# Patient Record
Sex: Male | Born: 1985 | Hispanic: Yes | Marital: Married | Smoking: Never smoker
Health system: Southern US, Community
[De-identification: ages and names within clinical notes are randomized; demographics above are authoritative.]

## PROBLEM LIST (undated history)

## (undated) DIAGNOSIS — Z789 Other specified health status: Secondary | ICD-10-CM

## (undated) HISTORY — PX: APPENDECTOMY: SHX54

## (undated) HISTORY — DX: Other specified health status: Z78.9

---

## 2014-04-24 ENCOUNTER — Emergency Department (INDEPENDENT_AMBULATORY_CARE_PROVIDER_SITE_OTHER): Payer: BC Managed Care – PPO

## 2014-04-24 ENCOUNTER — Emergency Department (INDEPENDENT_AMBULATORY_CARE_PROVIDER_SITE_OTHER)
Admission: EM | Admit: 2014-04-24 | Discharge: 2014-04-24 | Disposition: A | Discharge status: Home / Self Care | Payer: BC Managed Care – PPO | Source: Home / Self Care | Attending: Emergency Medicine | Admitting: Emergency Medicine

## 2014-04-24 ENCOUNTER — Encounter: Payer: Self-pay | Admitting: *Deleted

## 2014-04-24 DIAGNOSIS — R7611 Nonspecific reaction to tuberculin skin test without active tuberculosis: Secondary | ICD-10-CM | POA: Diagnosis not present

## 2014-04-24 NOTE — ED Provider Notes (Signed)
CSN: [id number]     Arrival date & time 04/24/14  1321 History   First MD Initiated Contact with Patient 04/24/14 1322   Seen in [facility] Saturday 04/24/2014  Chief Complaint  Patient presents with  . PPD Reading    HPI Pt Had a PPD placed on R arm 07/24/2014 at [facility]. The site is red and raised measuring 15mm. Pt denies any symptoms and feels fine. He is asymptomatic. No cough or fever or night sweats or chest pain shortness of breath or weight loss or fatigue. Review of systems otherwise negative. 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 I questioned him about any history of tuberculosis, and he denies ever being diagnosed with tuberculosis or exposure to anyone with TB to his knowledge. He recalls that he had multiple immunizations in [place] as a child, and he may have received the BCG vaccine as a child, but he is not certain of this. 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 History reviewed. No pertinent past medical history. Past Surgical History  Procedure Laterality Date  . Appendectomy     History reviewed. No pertinent family history. History  Substance Use Topics  . Smoking status: Never Smoker   . Smokeless tobacco: Never Used  . Alcohol Use: No    Review of Systems Negative Allergies  Review of patient's allergies indicates no known allergies.  Home Medications   Prior to Admission medications   Not on File   BP 128/83 mmHg  Pulse 77  Temp(Src) 98.4 F (36.9 C) (Oral)  Ht  (1.727 m)  Wt 215 lb 8 oz (97.75 kg)  BMI 32.77 kg/m2  SpO2 97% Physical Exam Pleasant muscular male no distress. Right forearm: Red indurated 15 mm (where PPD was applied 2 days ago) ED Course  Procedures (including critical  care time) Labs Review Labs Reviewed - No data to display  Imaging Review Dg Chest 1 View  04/24/2014   CLINICAL DATA:  Positive PPD.  Asymptomatic.  EXAM: CHEST  1 VIEW  COMPARISON:  None.  FINDINGS: The cardiomediastinal silhouette is unremarkable.  There is no evidence of focal airspace disease, pulmonary edema, suspicious pulmonary nodule/mass, pleural effusion, or pneumothorax. No acute bony abnormalities are identified.  IMPRESSION: No active disease.   Electronically Signed   By: Harmon Pier M.D.   On: 04/24/2014 14:12     MDM   1. Positive PPD    15 mm. Asymptomatic.--- Normal chest x-ray  Over 20 minutes spent, greater than 50% of the time spent for counseling and coordination of care. I explained to him that his PPD was positive, but chest x-ray normal. No symptoms or signs of active tuberculosis. I explained that, in my opinion, his tuberculosis screening is negative for signs or symptoms of tuberculosis, so he may proceed with employment. I completed the "record of tuberculosis screening" form, gave him a copy of the form and normal chest x-ray report. I advised him the importance of following up with his PCP, advised to show PCP the forms and chest x-ray report, so the PCP can make any further decisions regarding any follow-up or treatment of the +PPD. Also advised him to never have another TB skin test, because it will always  be positive. We'll send a copy of this visit note and the "record of tuberculosis screening" form and chest x-ray report [facility].      Lajean Manesavid Massey, MD 04/24/14 [phone]

## 2014-04-24 NOTE — ED Notes (Signed)
Pt  Had a PPD placed on R arm 9-7 at [facility]  The site is red and raised measuring 15mm.  Pt denies any symptoms and feels fine.

## 2015-03-31 ENCOUNTER — Other Ambulatory Visit: Payer: Self-pay | Admitting: Adult Health

## 2015-03-31 ENCOUNTER — Ambulatory Visit (INDEPENDENT_AMBULATORY_CARE_PROVIDER_SITE_OTHER): Payer: Self-pay

## 2015-03-31 DIAGNOSIS — M25472 Effusion, left ankle: Secondary | ICD-10-CM

## 2015-03-31 DIAGNOSIS — R52 Pain, unspecified: Secondary | ICD-10-CM

## 2016-05-02 ENCOUNTER — Other Ambulatory Visit: Payer: Self-pay | Admitting: Emergency Medicine

## 2016-05-02 ENCOUNTER — Ambulatory Visit (INDEPENDENT_AMBULATORY_CARE_PROVIDER_SITE_OTHER): Payer: Self-pay

## 2016-05-02 DIAGNOSIS — R52 Pain, unspecified: Secondary | ICD-10-CM

## 2016-05-02 DIAGNOSIS — M79644 Pain in right finger(s): Secondary | ICD-10-CM

## 2017-07-11 ENCOUNTER — Ambulatory Visit (INDEPENDENT_AMBULATORY_CARE_PROVIDER_SITE_OTHER): Payer: Self-pay

## 2017-07-11 ENCOUNTER — Other Ambulatory Visit: Payer: Self-pay | Admitting: Gerontology

## 2017-07-11 DIAGNOSIS — S60222A Contusion of left hand, initial encounter: Secondary | ICD-10-CM

## 2017-07-11 DIAGNOSIS — M79645 Pain in left finger(s): Secondary | ICD-10-CM

## 2017-08-28 DIAGNOSIS — R7303 Prediabetes: Secondary | ICD-10-CM | POA: Insufficient documentation

## 2018-12-22 DIAGNOSIS — Z6835 Body mass index (BMI) 35.0-35.9, adult: Secondary | ICD-10-CM | POA: Diagnosis not present

## 2018-12-22 DIAGNOSIS — Z20828 Contact with and (suspected) exposure to other viral communicable diseases: Secondary | ICD-10-CM | POA: Diagnosis not present

## 2018-12-22 DIAGNOSIS — E669 Obesity, unspecified: Secondary | ICD-10-CM | POA: Diagnosis not present

## 2019-02-07 IMAGING — DX DG HAND COMPLETE 3+V*L*
3 series · 3 of 3 positions shown · non-contrast
Comparison: None.

CLINICAL DATA: Contusion. Shut left hand in car door with pain
predominantly involving the long finger. Initial encounter.

EXAM:
LEFT HAND - COMPLETE 3+ VIEW

[hand pa]
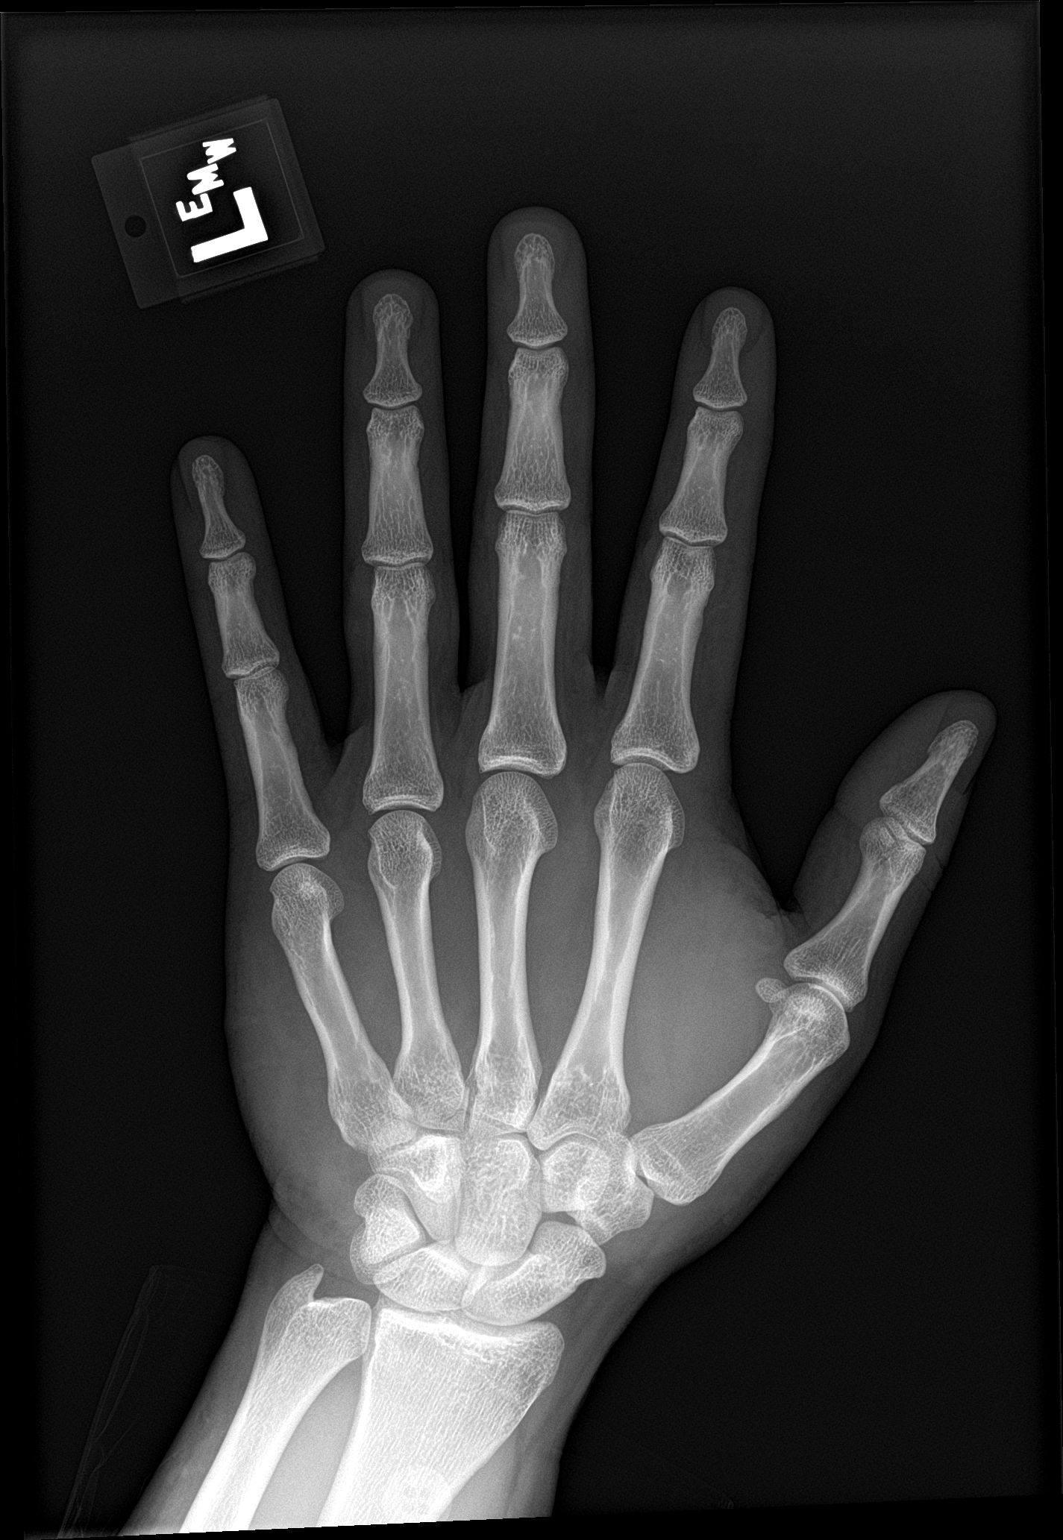

[hand obl]
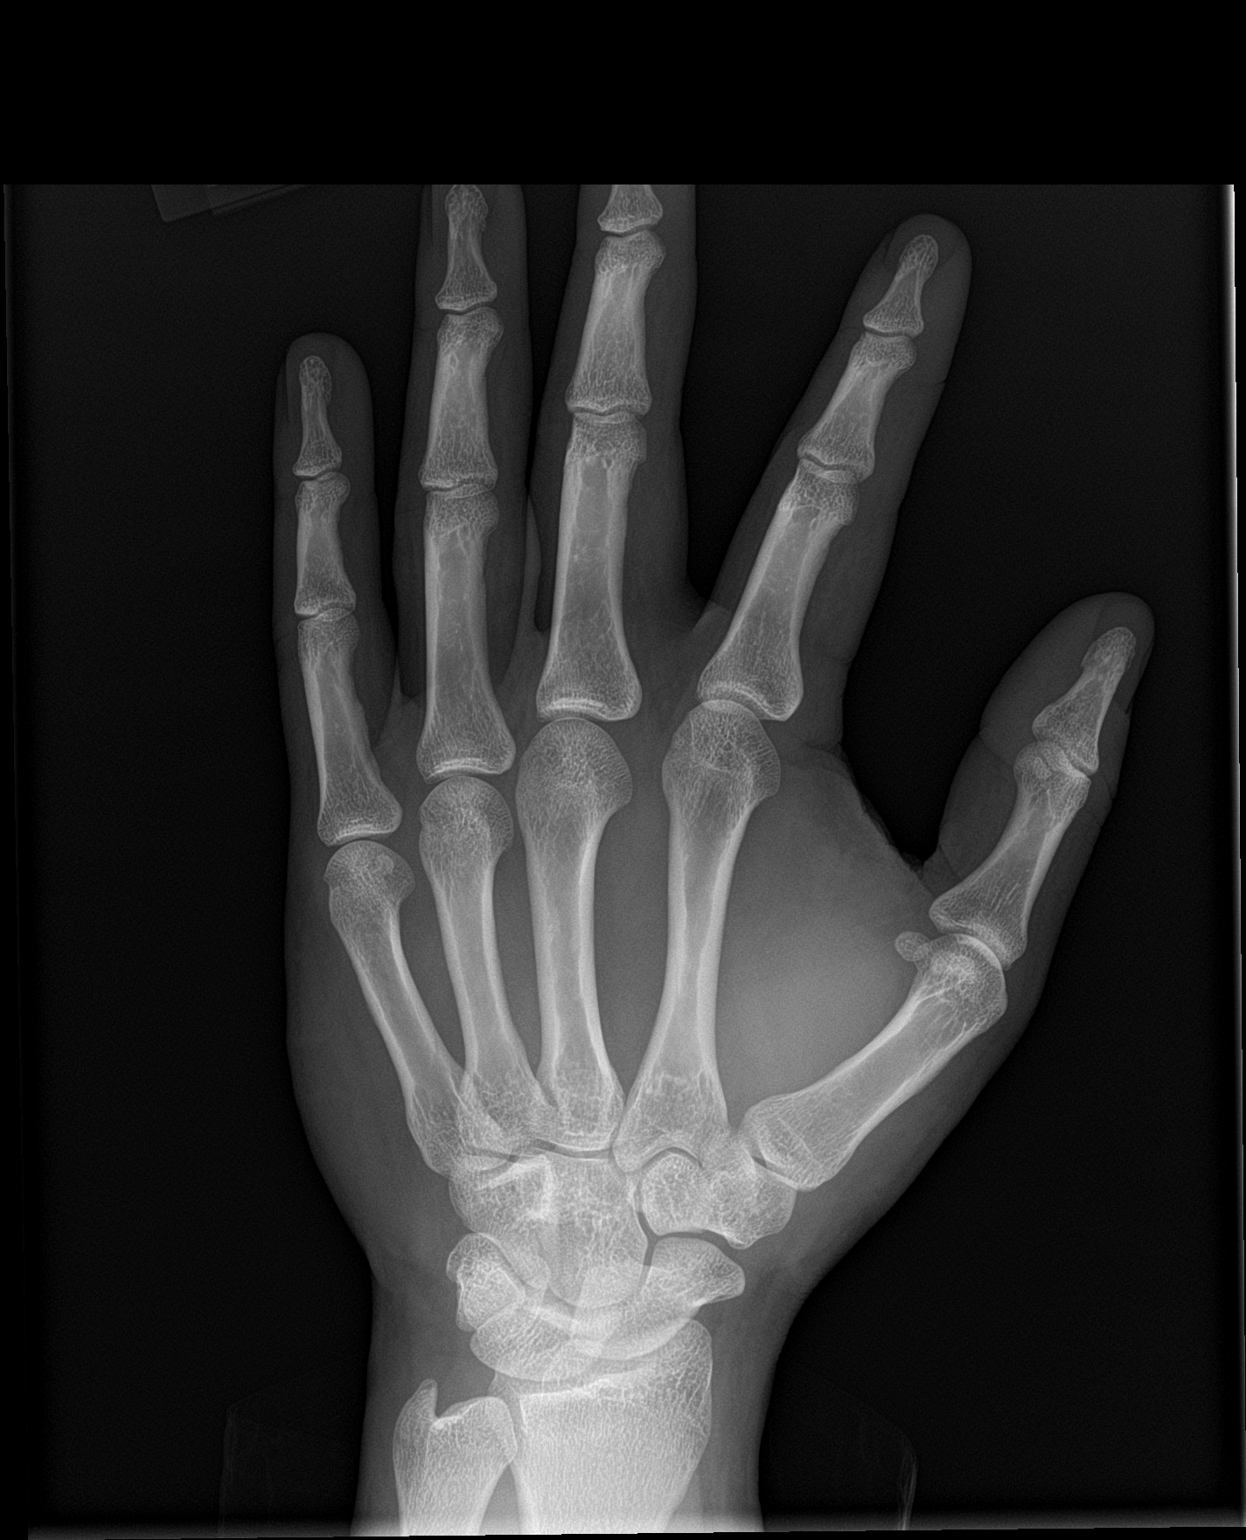

[hand lat]
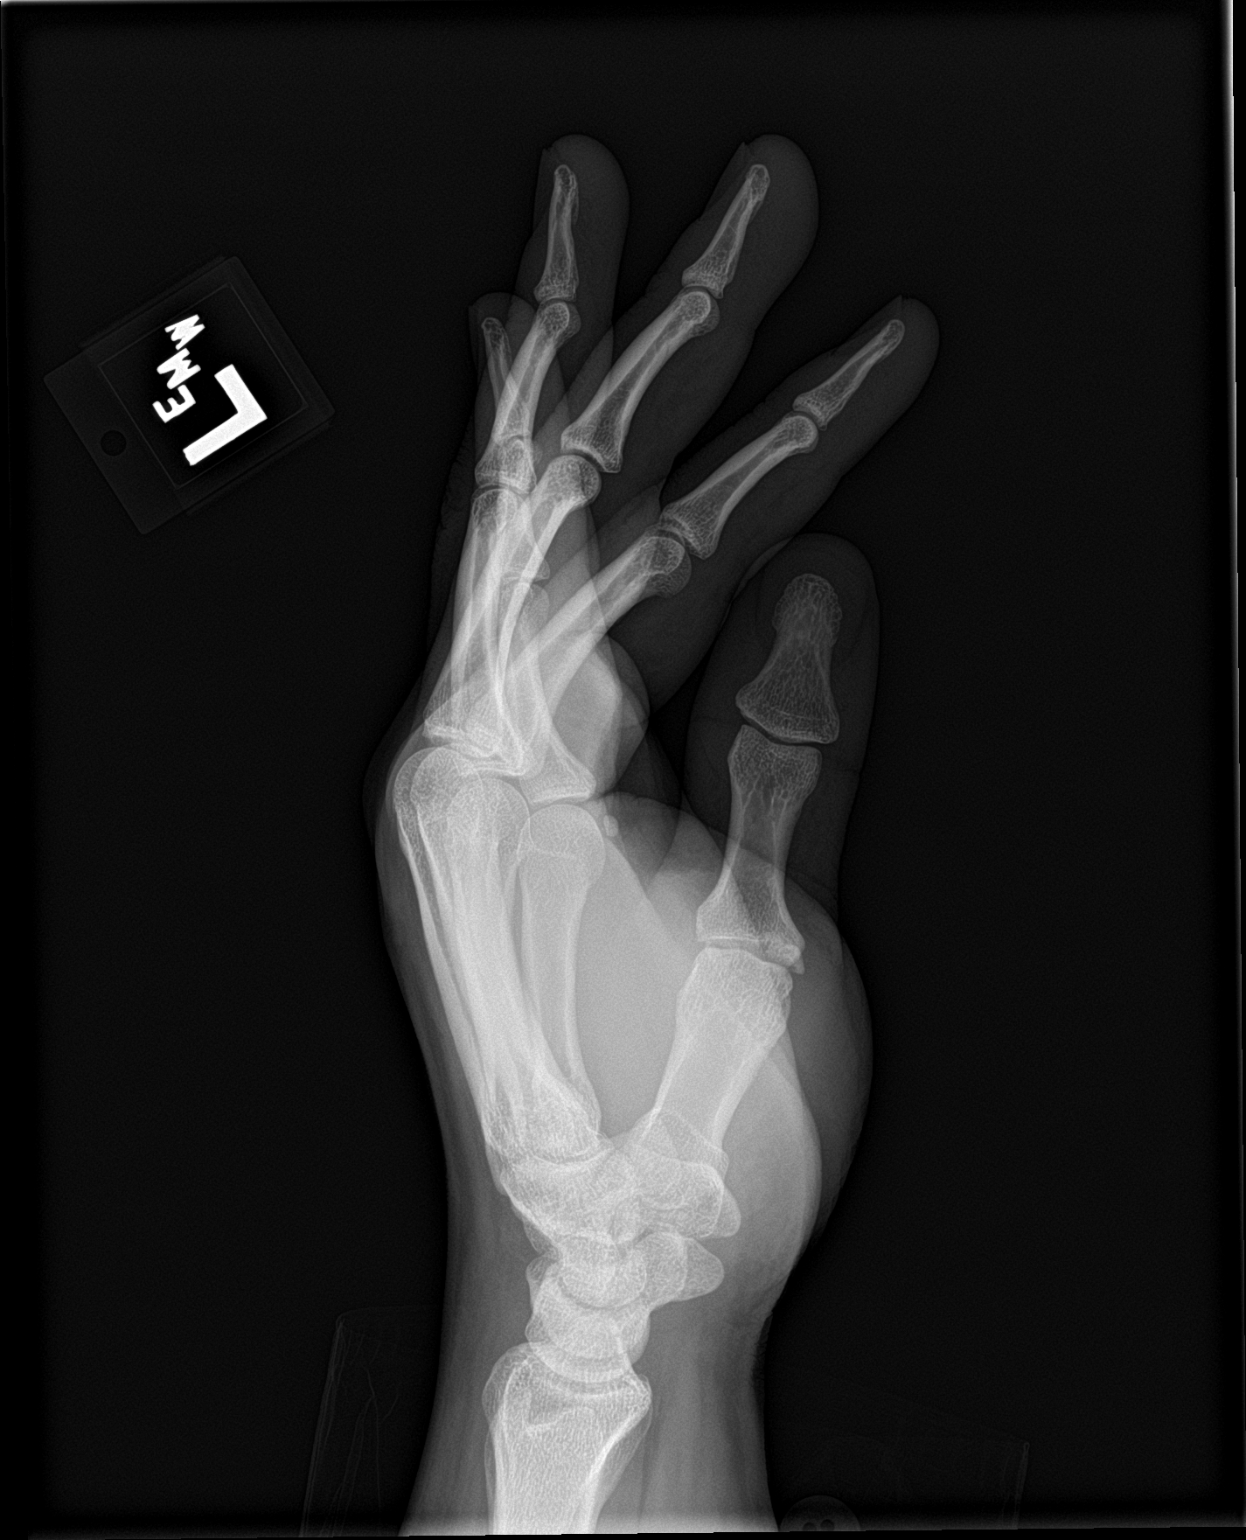

[3 of 3 positions shown; findings below may reference images not displayed]

FINDINGS: No acute fracture or dislocation is identified. Mild soft tissue
swelling is noted along the dorsum of the hand distally. Joint space
widths are preserved. No radiopaque foreign body is seen.
IMPRESSION: No acute osseous abnormality.

## 2019-05-15 DIAGNOSIS — M25561 Pain in right knee: Secondary | ICD-10-CM | POA: Diagnosis not present

## 2019-05-15 DIAGNOSIS — G8929 Other chronic pain: Secondary | ICD-10-CM | POA: Diagnosis not present

## 2019-05-15 DIAGNOSIS — M25461 Effusion, right knee: Secondary | ICD-10-CM | POA: Diagnosis not present

## 2019-05-22 DIAGNOSIS — G8929 Other chronic pain: Secondary | ICD-10-CM | POA: Diagnosis not present

## 2019-05-22 DIAGNOSIS — M25561 Pain in right knee: Secondary | ICD-10-CM | POA: Diagnosis not present

## 2019-05-22 DIAGNOSIS — S83281A Other tear of lateral meniscus, current injury, right knee, initial encounter: Secondary | ICD-10-CM | POA: Diagnosis not present

## 2019-05-22 DIAGNOSIS — M25461 Effusion, right knee: Secondary | ICD-10-CM | POA: Diagnosis not present

## 2019-05-28 DIAGNOSIS — M25461 Effusion, right knee: Secondary | ICD-10-CM | POA: Diagnosis not present

## 2019-05-28 DIAGNOSIS — S83281A Other tear of lateral meniscus, current injury, right knee, initial encounter: Secondary | ICD-10-CM | POA: Diagnosis not present

## 2019-05-28 DIAGNOSIS — S83269A Peripheral tear of lateral meniscus, current injury, unspecified knee, initial encounter: Secondary | ICD-10-CM | POA: Insufficient documentation

## 2019-06-04 DIAGNOSIS — Z20822 Contact with and (suspected) exposure to covid-19: Secondary | ICD-10-CM | POA: Diagnosis not present

## 2019-06-04 DIAGNOSIS — X58XXXA Exposure to other specified factors, initial encounter: Secondary | ICD-10-CM | POA: Diagnosis not present

## 2019-06-04 DIAGNOSIS — S83281A Other tear of lateral meniscus, current injury, right knee, initial encounter: Secondary | ICD-10-CM | POA: Diagnosis not present

## 2019-06-04 DIAGNOSIS — Z01812 Encounter for preprocedural laboratory examination: Secondary | ICD-10-CM | POA: Diagnosis not present

## 2019-06-08 DIAGNOSIS — S83281A Other tear of lateral meniscus, current injury, right knee, initial encounter: Secondary | ICD-10-CM | POA: Diagnosis not present

## 2019-06-08 DIAGNOSIS — E669 Obesity, unspecified: Secondary | ICD-10-CM | POA: Diagnosis not present

## 2019-06-08 DIAGNOSIS — M23200 Derangement of unspecified lateral meniscus due to old tear or injury, right knee: Secondary | ICD-10-CM | POA: Diagnosis not present

## 2019-06-16 DIAGNOSIS — R942 Abnormal results of pulmonary function studies: Secondary | ICD-10-CM | POA: Diagnosis not present

## 2019-06-16 DIAGNOSIS — M25461 Effusion, right knee: Secondary | ICD-10-CM | POA: Diagnosis not present

## 2019-06-19 DIAGNOSIS — M25461 Effusion, right knee: Secondary | ICD-10-CM | POA: Diagnosis not present

## 2019-08-21 DIAGNOSIS — Z20822 Contact with and (suspected) exposure to covid-19: Secondary | ICD-10-CM | POA: Diagnosis not present

## 2020-01-14 DIAGNOSIS — Z20822 Contact with and (suspected) exposure to covid-19: Secondary | ICD-10-CM | POA: Diagnosis not present

## 2020-01-14 DIAGNOSIS — J069 Acute upper respiratory infection, unspecified: Secondary | ICD-10-CM | POA: Diagnosis not present

## 2020-01-14 DIAGNOSIS — Z03818 Encounter for observation for suspected exposure to other biological agents ruled out: Secondary | ICD-10-CM | POA: Diagnosis not present

## 2020-08-30 ENCOUNTER — Other Ambulatory Visit: Payer: Self-pay

## 2020-08-30 ENCOUNTER — Emergency Department
Admission: EM | Admit: 2020-08-30 | Discharge: 2020-08-30 | Disposition: A | Discharge status: Home / Self Care | Payer: BC Managed Care – PPO | Source: Home / Self Care | Attending: Family Medicine | Admitting: Family Medicine

## 2020-08-30 DIAGNOSIS — A09 Infectious gastroenteritis and colitis, unspecified: Secondary | ICD-10-CM | POA: Diagnosis not present

## 2020-08-30 LAB — POCT URINALYSIS DIP (MANUAL ENTRY)
Bilirubin, UA: NEGATIVE
Blood, UA: NEGATIVE
Glucose, UA: NEGATIVE mg/dL
Ketones, POC UA: NEGATIVE mg/dL
Leukocytes, UA: NEGATIVE
Nitrite, UA: NEGATIVE
Protein Ur, POC: NEGATIVE mg/dL
Spec Grav, UA: 1.02 (ref 1.010–1.025)
Urobilinogen, UA: 0.2 E.U./dL
pH, UA: 7 (ref 5.0–8.0)

## 2020-08-30 MED ORDER — CIPROFLOXACIN HCL 500 MG PO TABS: 500.0000 mg | ORAL_TABLET | Freq: Two times a day (BID) | ORAL | 0 refills | Status: DC

## 2020-08-30 NOTE — ED Triage Notes (Signed)
Pt c/o rectal pain since returning from [place] on 7/31. Says he was sick with diarrhea and a fever for the first 3 days after returning. Denies any rectal bleeding. Some urinary frequency as well. Says he gets pelvic pain when he has the urge to urinate. Pain 4/10

## 2020-08-30 NOTE — ED Provider Notes (Signed)
[facility]    CSN: [id number] Arrival date & time: 08/30/20  1055      History   Chief Complaint Chief Complaint  Patient presents with   Urinary Frequency   Rectal Pain    HPI Randy Hatfield is a 35 y.o. male.   HPI  Patient traveled to [place] to visit with his mother and father.  He states that a few days after he arrived he had some diarrhea, body aches headache and fever.  This went away over a few days.  He did not have any COVID testing.  He is COVID vaccinated.  He states afterwards both of his parents had headaches and body aches as well.  He states they are not vaccinated.  In any event towards the end of his 2 weeks that he had diarrhea again.  At this time he thought it might be just from the food.  Now that he is home, 3 days after coming home he has had diarrhea a third time.  This is 3 times in the last couple of weeks.  He states that he has some rectal discomfort due to the recurring diarrhea.  He also has some dysuria.  Patient is married.  He had no sexual relations while he was in [place].  No concern for STD or urethritis.  History reviewed. No pertinent past medical history.  There are no problems to display for this patient.   Past Surgical History:  Procedure Laterality Date   APPENDECTOMY         Home Medications    Prior to Admission medications   Medication Sig Start Date End Date Taking? Authorizing Provider  ciprofloxacin (CIPRO) 500 MG tablet Take 1 tablet (500 mg total) by mouth 2 (two) times daily. 08/30/20  Yes Eustace Moore, MD    Family History History reviewed. No pertinent family history.  Social History Social History   Tobacco Use   Smoking status: Never   Smokeless tobacco: Never  Substance Use Topics   Alcohol use: No   Drug use: No     Allergies   Amoxicillin   Review of Systems Review of Systems See HPI  Physical Exam Triage Vital Signs ED Triage Vitals  Enc Vitals Group     BP 08/30/20 1107  (!) 136/91     Pulse Rate 08/30/20 1107 80     Resp 08/30/20 1107 17     Temp 08/30/20 1107 98.6 F (37 C)     Temp Source 08/30/20 1107 Oral     SpO2 08/30/20 1107 99 %     Weight --      Height --      Head Circumference --      Peak Flow --      Pain Score 08/30/20 1113 4     Pain Loc --      Pain Edu? --      Excl. in GC? --    No data found.  Updated Vital Signs BP (!) 136/91 (BP Location: Right Arm)   Pulse 80   Temp 98.6 F (37 C) (Oral)   Resp 17   SpO2 99%      Physical Exam Constitutional:      General: He is not in acute distress.    Appearance: He is well-developed.     Comments: No distress.  Muscular build  HENT:     Head: Normocephalic and atraumatic.     Mouth/Throat:     Comments: Mask is  in place Eyes:     Conjunctiva/sclera: Conjunctivae normal.     Pupils: Pupils are equal, round, and reactive to light.  Cardiovascular:     Rate and Rhythm: Normal rate.  Pulmonary:     Effort: Pulmonary effort is normal. No respiratory distress.  Abdominal:     General: There is no distension.     Palpations: Abdomen is soft.     Tenderness: There is no abdominal tenderness.  Musculoskeletal:        General: Normal range of motion.     Cervical back: Normal range of motion.  Skin:    General: Skin is warm and dry.  Neurological:     Mental Status: He is alert.  Psychiatric:        Mood and Affect: Mood normal.        Behavior: Behavior normal.     UC Treatments / Results  Labs (all labs ordered are listed, but only abnormal results are displayed) Labs Reviewed  POCT URINALYSIS DIP (MANUAL ENTRY)    EKG   Radiology No results found.  Procedures Procedures (including critical care time)  Medications Ordered in UC Medications - No data to display  Initial Impression / Assessment and Plan / UC Course  I have reviewed the triage vital signs and the nursing notes.  Pertinent labs & imaging results that were available during my care of the  patient were reviewed by me and considered in my medical decision making (see chart for details).     Urinalysis is normal.  No evidence of UTI.  I explained to the patient that with 3 bouts of diarrhea, I he may have some traveler's diarrhea.  Often this will clear by itself I have concerns that he still having diarrhea now that he is home.  His first bout of diarrhea that was accompanied by fever headache and body aches could have been a COVID infection.  COVID does cause diarrhea and people that are vaccinated.  It is possible that the COVID infection left him with a decreased ability to fight off the traveler's diarrhea.  In any event I am going to give him 5 days of Cipro.  Push fluids.  Call if fails to improve Final Clinical Impressions(s) / UC Diagnoses   Final diagnoses:  Travelers' diarrhea     Discharge Instructions       Drink lots of water  take Cipro 2 times a day for 5 days Call or return if not better in a week Consider follow-up with primary care for preventative medicine and regular checkups    ED Prescriptions     Medication Sig Dispense Auth. Provider   ciprofloxacin (CIPRO) 500 MG tablet Take 1 tablet (500 mg total) by mouth 2 (two) times daily. 10 tablet Eustace Moore, MD      PDMP not reviewed this encounter.   Eustace Moore, MD 08/30/20 1501

## 2020-08-30 NOTE — Discharge Instructions (Addendum)
  Drink lots of water  take Cipro 2 times a day for 5 days Call or return if not better in a week Consider follow-up with primary care for preventative medicine and regular checkups

## 2020-10-06 ENCOUNTER — Encounter: Payer: Self-pay | Admitting: Family Medicine

## 2020-10-06 ENCOUNTER — Ambulatory Visit: Payer: BC Managed Care – PPO | Admitting: Family Medicine

## 2020-10-06 ENCOUNTER — Other Ambulatory Visit: Payer: Self-pay

## 2020-10-06 VITALS — BP 130/65 | HR 88 | Ht 68.0 in | Wt 242.0 lb

## 2020-10-06 DIAGNOSIS — R300 Dysuria: Secondary | ICD-10-CM | POA: Insufficient documentation

## 2020-10-06 DIAGNOSIS — Z1159 Encounter for screening for other viral diseases: Secondary | ICD-10-CM

## 2020-10-06 DIAGNOSIS — Z114 Encounter for screening for human immunodeficiency virus [HIV]: Secondary | ICD-10-CM | POA: Diagnosis not present

## 2020-10-06 DIAGNOSIS — Z0000 Encounter for general adult medical examination without abnormal findings: Secondary | ICD-10-CM

## 2020-10-06 DIAGNOSIS — Z13220 Encounter for screening for lipoid disorders: Secondary | ICD-10-CM | POA: Diagnosis not present

## 2020-10-06 LAB — POCT URINALYSIS DIP (CLINITEK)
Bilirubin, UA: NEGATIVE
Blood, UA: NEGATIVE
Glucose, UA: NEGATIVE mg/dL
Ketones, POC UA: NEGATIVE mg/dL
Leukocytes, UA: NEGATIVE
Nitrite, UA: NEGATIVE
POC PROTEIN,UA: NEGATIVE
Spec Grav, UA: 1.02 (ref 1.010–1.025)
Urobilinogen, UA: 0.2 E.U./dL
pH, UA: 6.5 (ref 5.0–8.0)

## 2020-10-06 MED ORDER — DOXYCYCLINE HYCLATE 100 MG PO TABS: 100.0000 mg | ORAL_TABLET | Freq: Two times a day (BID) | ORAL | 0 refills | Status: AC

## 2020-10-06 NOTE — Progress Notes (Signed)
Randy Hatfield - 35 y.o. male MRN [id number]  Date of birth: 02/25/1985  Subjective Chief Complaint  Patient presents with   Establish Care    HPI Randy Hatfield is a 35 year old male here today for initial visit to establish care.  Recently seen in urgent care with complaint of diarrhea and dysuria.  He had recently returned from [place].  He was treated with Cipro for urethritis and traveler's diarrhea.  His diarrhea has resolved.  His dysuria had improved however a few days after discontinuing antibiotics the symptoms returned.  He has not had urinary frequency, testicular pain, flank pain, blood in his urine or pain with intercourse.  He denies fever, chills or rash.  He denies exposure to STI.  ROS:  A comprehensive ROS was completed and negative except as noted per HPI    History reviewed. No pertinent past medical history.  Past Surgical History:  Procedure Laterality Date   APPENDECTOMY      Social History   Socioeconomic History   Marital status: Married    Spouse name: Not on file   Number of children: Not on file   Years of education: Not on file   Highest education level: Not on file  Occupational History   Not on file  Tobacco Use   Smoking status: Never   Smokeless tobacco: Never  Substance and Sexual Activity   Alcohol use: Yes    Comment: rarely   Drug use: Never   Sexual activity: Yes    Partners: Female  Other Topics Concern   Not on file  Social History Narrative   Not on file   Social Determinants of Health   Financial Resource Strain: Not on file  Food Insecurity: Not on file  Transportation Needs: Not on file  Physical Activity: Not on file  Stress: Not on file  Social Connections: Not on file    No family history on file.  Health Maintenance  Topic Date Due   COVID-19 Vaccine (1) Never done   Hepatitis C Screening  Never done   TETANUS/TDAP  Never done   INFLUENZA VACCINE  08/15/2020   HIV Screening  Completed   HPV VACCINES  Aged Out      ----------------------------------------------------------------------------------------------------------------------------------------------------------------------------------------------------------------- Physical Exam BP 130/65   Pulse 88   Ht 5\' 8"  (1.727 m)   Wt 242 lb (109.8 kg)   SpO2 97%   BMI 36.80 kg/m   Physical Exam Constitutional:      Appearance: Normal appearance.  Eyes:     General: No scleral icterus. Cardiovascular:     Rate and Rhythm: Normal rate and regular rhythm.  Musculoskeletal:     Cervical back: Neck supple.  Neurological:     General: No focal deficit present.     Mental Status: He is alert.  Psychiatric:        Mood and Affect: Mood normal.        Behavior: Behavior normal.    ------------------------------------------------------------------------------------------------------------------------------------------------------------------------------------------------------------------- Assessment and Plan  Burning with urination He has symptoms that are consistent with urethritis.  Denies potential exposure to STI.  Will cover empirically with doxycycline x10 days.  Recheck urine and urine culture.   Meds ordered this encounter  Medications   doxycycline (VIBRA-TABS) 100 MG tablet    Sig: Take 1 tablet (100 mg total) by mouth 2 (two) times daily for 10 days.    Dispense:  20 tablet    Refill:  0    No follow-ups on file.    This visit  occurred during the SARS-CoV-2 public health emergency.  Safety protocols were in place, including screening questions prior to the visit, additional usage of staff PPE, and extensive cleaning of exam room while observing appropriate contact time as indicated for disinfecting solutions.

## 2020-10-06 NOTE — Assessment & Plan Note (Signed)
He has symptoms that are consistent with urethritis.  Denies potential exposure to STI.  Will cover empirically with doxycycline x10 days.  Recheck urine and urine culture.

## 2020-10-07 LAB — LIPID PANEL W/REFLEX DIRECT LDL
Cholesterol: 221 mg/dL — ABNORMAL HIGH (ref <200)
HDL: 41 mg/dL (ref >=40)
LDL Cholesterol (Calc): 142 mg/dL (calc) — ABNORMAL HIGH
Non-HDL Cholesterol (Calc): 180 mg/dL (calc) — ABNORMAL HIGH (ref <130)
Total CHOL/HDL Ratio: 5.4 (calc) — ABNORMAL HIGH (ref <5.0)
Triglycerides: 234 mg/dL — ABNORMAL HIGH (ref <150)

## 2020-10-07 LAB — CBC WITH DIFFERENTIAL/PLATELET
Absolute Monocytes: 429 cells/uL (ref 200–950)
Basophils Absolute: 19 cells/uL (ref 0–200)
Basophils Relative: 0.3 %
Eosinophils Absolute: 77 cells/uL (ref 15–500)
Eosinophils Relative: 1.2 %
HCT: 45 % (ref 38.5–50.0)
Hemoglobin: 15.1 g/dL (ref 13.2–17.1)
Lymphs Abs: 2330 cells/uL (ref 850–3900)
MCH: 30 pg (ref 27.0–33.0)
MCHC: 33.6 g/dL (ref 32.0–36.0)
MCV: 89.5 fL (ref 80.0–100.0)
MPV: 10.8 fL (ref 7.5–12.5)
Monocytes Relative: 6.7 %
Neutro Abs: 3546 cells/uL (ref 1500–7800)
Neutrophils Relative %: 55.4 %
Platelets: 262 10*3/uL (ref 140–400)
RBC: 5.03 10*6/uL (ref 4.20–5.80)
RDW: 12.7 % (ref 11.0–15.0)
Total Lymphocyte: 36.4 %
WBC: 6.4 10*3/uL (ref 3.8–10.8)

## 2020-10-07 LAB — COMPLETE METABOLIC PANEL WITH GFR
AG Ratio: 1.4 (calc) (ref 1.0–2.5)
ALT: 31 U/L (ref 9–46)
AST: 28 U/L (ref 10–40)
Albumin: 4.6 g/dL (ref 3.6–5.1)
Alkaline phosphatase (APISO): 66 U/L (ref 36–130)
BUN: 10 mg/dL (ref 7–25)
CO2: 26 mmol/L (ref 20–32)
Calcium: 9.8 mg/dL (ref 8.6–10.3)
Chloride: 105 mmol/L (ref 98–110)
Creat: 1.25 mg/dL (ref 0.60–1.26)
Globulin: 3.2 g/dL (calc) (ref 1.9–3.7)
Glucose, Bld: 82 mg/dL (ref 65–99)
Potassium: 3.9 mmol/L (ref 3.5–5.3)
Sodium: 140 mmol/L (ref 135–146)
Total Bilirubin: 1 mg/dL (ref 0.2–1.2)
Total Protein: 7.8 g/dL (ref 6.1–8.1)
eGFR: 78 mL/min/{1.73_m2} (ref >=60)

## 2020-10-07 LAB — HEPATITIS C ANTIBODY
Hepatitis C Ab: NONREACTIVE
SIGNAL TO CUT-OFF: 0.01 (ref <1.00)

## 2020-10-07 LAB — HIV ANTIBODY (ROUTINE TESTING W REFLEX): HIV 1&2 Ab, 4th Generation: NONREACTIVE

## 2020-10-08 LAB — URINE CULTURE
MICRO NUMBER:: 12413074
Result:: NO GROWTH
SPECIMEN QUALITY:: ADEQUATE

## 2020-11-07 ENCOUNTER — Ambulatory Visit (INDEPENDENT_AMBULATORY_CARE_PROVIDER_SITE_OTHER): Payer: BC Managed Care – PPO | Admitting: Family Medicine

## 2020-11-07 ENCOUNTER — Other Ambulatory Visit: Payer: Self-pay

## 2020-11-07 ENCOUNTER — Encounter: Payer: Self-pay | Admitting: Family Medicine

## 2020-11-07 VITALS — BP 126/80 | HR 91 | Temp 98.0°F | Ht 68.0 in | Wt 241.0 lb

## 2020-11-07 DIAGNOSIS — Z0000 Encounter for general adult medical examination without abnormal findings: Secondary | ICD-10-CM

## 2020-11-07 DIAGNOSIS — N342 Other urethritis: Secondary | ICD-10-CM

## 2020-11-07 DIAGNOSIS — R300 Dysuria: Secondary | ICD-10-CM | POA: Diagnosis not present

## 2020-11-07 NOTE — Patient Instructions (Signed)
Preventive Care 21-33 Years Old, Male Preventive care refers to lifestyle choices and visits with your health care provider that can promote health and wellness. This includes: A yearly physical exam. This is also called an annual wellness visit. Regular dental and eye exams. Immunizations. Screening for certain conditions. Healthy lifestyle choices, such as: Eating a healthy diet. Getting regular exercise. Not using drugs or products that contain nicotine and tobacco. Limiting alcohol use. What can I expect for my preventive care visit? Physical exam Your health care provider may check your: Height and weight. These may be used to calculate your BMI (body mass index). BMI is a measurement that tells if you are at a healthy weight. Heart rate and blood pressure. Body temperature. Skin for abnormal spots. Counseling Your health care provider may ask you questions about your: Past medical problems. Family's medical history. Alcohol, tobacco, and drug use. Emotional well-being. Home life and relationship well-being. Sexual activity. Diet, exercise, and sleep habits. Work and work environment. Access to firearms. What immunizations do I need? Vaccines are usually given at various ages, according to a schedule. Your health care provider will recommend vaccines for you based on your age, medical history, and lifestyle or other factors, such as travel or where you work. What tests do I need? Blood tests Lipid and cholesterol levels. These may be checked every 5 years starting at age 20. Hepatitis C test. Hepatitis B test. Screening  Diabetes screening. This is done by checking your blood sugar (glucose) after you have not eaten for a while (fasting). Genital exam to check for testicular cancer or hernias. STD (sexually transmitted disease) testing, if you are at risk. Talk with your health care provider about your test results, treatment options, and if necessary, the need for more  tests. Follow these instructions at home: Eating and drinking  Eat a healthy diet that includes fresh fruits and vegetables, whole grains, lean protein, and low-fat dairy products. Drink enough fluid to keep your urine pale yellow. Take vitamin and mineral supplements as recommended by your health care provider. Do not drink alcohol if your health care provider tells you not to drink. If you drink alcohol: Limit how much you have to 0-2 drinks a day. Be aware of how much alcohol is in your drink. In the [place] one drink equals one 12 oz bottle of beer (355 mL), one 5 oz glass of wine (148 mL), or one 1 oz glass of hard liquor (44 mL). Lifestyle Take daily care of your teeth and gums. Brush your teeth every morning and night with fluoride toothpaste. Floss one time each day. Stay active. Exercise for at least 30 minutes 5 or more days each week. Do not use any products that contain nicotine or tobacco, such as cigarettes, e-cigarettes, and chewing tobacco. If you need help quitting, ask your health care provider. Do not use drugs. If you are sexually active, practice safe sex. Use a condom or other form of protection to prevent STIs (sexually transmitted infections). Find healthy ways to cope with stress, such as: Meditation, yoga, or listening to music. Journaling. Talking to a trusted person. Spending time with friends and family. Safety Always wear your seat belt while driving or riding in a vehicle. Do not drive: If you have been drinking alcohol. Do not ride with someone who has been drinking. When you are tired or distracted. While texting. Wear a helmet and other protective equipment during sports activities. If you have firearms in your house, make sure   you follow all gun safety procedures. Seek help if you have been physically or sexually abused. What's next? Go to your health care provider once a year for an annual wellness visit. Ask your health care provider how often you  should have your eyes and teeth checked. Stay up to date on all vaccines. This information is not intended to replace advice given to you by your health care provider. Make sure you discuss any questions you have with your health care provider. Document Revised: 03/11/2020 Document Reviewed: 12/26/2017 Elsevier Patient Education  2022 Elsevier Inc.  

## 2020-11-07 NOTE — Assessment & Plan Note (Signed)
Well adult Recent labs reviewed with him.  Cholesterol elevated.  He will continue to work on dietary changes for improvement of this. Immunizations: Declines flu vaccine. Screenings: Up-to-date Anticipatory guidance/risk factor reduction: Recommendations per AVS.

## 2020-11-07 NOTE — Progress Notes (Signed)
Saban Heinlen - 35 y.o. male MRN [id number]  Date of birth: 12/06/1985  Subjective Chief Complaint  Patient presents with   Annual Exam    HPI Ibrahim is a 35 year old male here today for annual exam.  He reports he is doing well at this time.  His burning with urination has improved some but is not fully resolved.  He would like to see urology for continued symptoms.  He did have labs completed prior to visit today.  Cholesterol is elevated.  He states he knows he could make some changes to his diet to help with this.  He does exercise regularly.  He is a non-smoker and rarely consumes alcohol.  He is not interested in having flu vaccine at this time.  Review of Systems  Constitutional:  Negative for chills, fever, malaise/fatigue and weight loss.  HENT:  Negative for congestion, ear pain and sore throat.   Eyes:  Negative for blurred vision, double vision and pain.  Respiratory:  Negative for cough and shortness of breath.   Cardiovascular:  Negative for chest pain and palpitations.  Gastrointestinal:  Negative for abdominal pain, blood in stool, constipation, heartburn and nausea.  Genitourinary:  Negative for dysuria and urgency.  Musculoskeletal:  Negative for joint pain and myalgias.  Neurological:  Negative for dizziness and headaches.  Endo/Heme/Allergies:  Does not bruise/bleed easily.  Psychiatric/Behavioral:  Negative for depression. The patient is not nervous/anxious and does not have insomnia.    Allergies  Allergen Reactions   Amoxicillin Nausea Only    History reviewed. No pertinent past medical history.  Past Surgical History:  Procedure Laterality Date   APPENDECTOMY      Social History   Socioeconomic History   Marital status: Married    Spouse name: Not on file   Number of children: Not on file   Years of education: Not on file   Highest education level: Not on file  Occupational History   Not on file  Tobacco Use   Smoking status: Never    Smokeless tobacco: Never  Substance and Sexual Activity   Alcohol use: Yes    Comment: rarely   Drug use: Never   Sexual activity: Yes    Partners: Female  Other Topics Concern   Not on file  Social History Narrative   Not on file   Social Determinants of Health   Financial Resource Strain: Not on file  Food Insecurity: Not on file  Transportation Needs: Not on file  Physical Activity: Not on file  Stress: Not on file  Social Connections: Not on file    History reviewed. No pertinent family history.  Health Maintenance  Topic Date Due   COVID-19 Vaccine (1) Never done   TETANUS/TDAP  Never done   INFLUENZA VACCINE  08/15/2020   Hepatitis C Screening  Completed   HIV Screening  Completed   Pneumococcal Vaccine 65-46 Years old  Aged Out   HPV VACCINES  Aged Out     ----------------------------------------------------------------------------------------------------------------------------------------------------------------------------------------------------------------- Physical Exam BP 126/80 (BP Location: Left Arm, Patient Position: Sitting, Cuff Size: Large)   Pulse 91   Temp 98 F (36.7 C)   Ht 5\' 8"  (1.727 m)   Wt 241 lb (109.3 kg)   SpO2 98%   BMI 36.64 kg/m   Physical Exam Constitutional:      General: He is not in acute distress. HENT:     Head: Normocephalic and atraumatic.     Right Ear: Tympanic membrane and external ear normal.  Left Ear: Tympanic membrane and external ear normal.  Eyes:     General: No scleral icterus. Neck:     Thyroid: No thyromegaly.  Cardiovascular:     Rate and Rhythm: Normal rate and regular rhythm.     Heart sounds: Normal heart sounds.  Pulmonary:     Effort: Pulmonary effort is normal.     Breath sounds: Normal breath sounds.  Abdominal:     General: Bowel sounds are normal. There is no distension.     Palpations: Abdomen is soft.     Tenderness: There is no abdominal tenderness. There is no guarding.   Musculoskeletal:     Cervical back: Normal range of motion.  Lymphadenopathy:     Cervical: No cervical adenopathy.  Skin:    General: Skin is warm and dry.     Findings: No rash.  Neurological:     Mental Status: He is alert and oriented to person, place, and time.     Cranial Nerves: No cranial nerve deficit.     Motor: No abnormal muscle tone.  Psychiatric:        Mood and Affect: Mood normal.        Behavior: Behavior normal.    ------------------------------------------------------------------------------------------------------------------------------------------------------------------------------------------------------------------- Assessment and Plan  Burning with urination Symptoms overall have improved but continues to have intermittent symptoms.  Referral placed to urology.  Well adult exam Well adult Recent labs reviewed with him.  Cholesterol elevated.  He will continue to work on dietary changes for improvement of this. Immunizations: Declines flu vaccine. Screenings: Up-to-date Anticipatory guidance/risk factor reduction: Recommendations per AVS.   No orders of the defined types were placed in this encounter.   No follow-ups on file.    This visit occurred during the SARS-CoV-2 public health emergency.  Safety protocols were in place, including screening questions prior to the visit, additional usage of staff PPE, and extensive cleaning of exam room while observing appropriate contact time as indicated for disinfecting solutions.

## 2020-11-07 NOTE — Assessment & Plan Note (Signed)
Symptoms overall have improved but continues to have intermittent symptoms.  Referral placed to urology.

## 2020-12-28 DIAGNOSIS — R7989 Other specified abnormal findings of blood chemistry: Secondary | ICD-10-CM | POA: Diagnosis not present

## 2020-12-28 DIAGNOSIS — M6289 Other specified disorders of muscle: Secondary | ICD-10-CM | POA: Diagnosis not present

## 2020-12-28 DIAGNOSIS — N342 Other urethritis: Secondary | ICD-10-CM | POA: Diagnosis not present

## 2023-04-16 ENCOUNTER — Encounter: Payer: Self-pay | Admitting: Family Medicine

## 2023-04-16 ENCOUNTER — Ambulatory Visit: Admitting: Family Medicine

## 2023-04-16 VITALS — BP 131/72 | HR 82 | Ht 68.0 in | Wt 241.0 lb

## 2023-04-16 DIAGNOSIS — Z3009 Encounter for other general counseling and advice on contraception: Secondary | ICD-10-CM | POA: Diagnosis not present

## 2023-04-16 DIAGNOSIS — R030 Elevated blood-pressure reading, without diagnosis of hypertension: Secondary | ICD-10-CM | POA: Diagnosis not present

## 2023-04-16 NOTE — Assessment & Plan Note (Signed)
 Referral placed to urology.

## 2023-04-16 NOTE — Progress Notes (Signed)
 Randy Hatfield - 38 y.o. male MRN [id number]  Date of birth: December 03, 1985  Subjective Chief Complaint  Patient presents with   Sterilization    HPI Randy Hatfield is a 38 y.o. male here today to discuss referral to have vasectomy.  He has 3 children and does not   He BP is also elevated on initial check.  He denies symptoms including chest pain, shortness of breath, palpitations, headache or vision changes.   ROS:  A comprehensive ROS was completed and negative except as noted per HPI  Allergies  Allergen Reactions   Amoxicillin Nausea Only    History reviewed. No pertinent past medical history.  Past Surgical History:  Procedure Laterality Date   APPENDECTOMY      Social History   Socioeconomic History   Marital status: Married    Spouse name: Not on file   Number of children: Not on file   Years of education: Not on file   Highest education level: Not on file  Occupational History   Not on file  Tobacco Use   Smoking status: Never   Smokeless tobacco: Never  Substance and Sexual Activity   Alcohol use: Yes    Comment: rarely   Drug use: Never   Sexual activity: Yes    Partners: Female  Other Topics Concern   Not on file  Social History Narrative   Not on file   Social Drivers of Health   Financial Resource Strain: Not on file  Food Insecurity: No Food Insecurity (12/28/2020)   Received from [facility]   Hunger Vital Sign    Worried About Running Out of Food in the Last Year: Never true    Ran Out of Food in the Last Year: Never true  Transportation Needs: Not on file  Physical Activity: Not on file  Stress: Not on file  Social Connections: Unknown (05/20/2021)   Received from [facility]   Social Network    Social Network: Not on file    History reviewed. No pertinent family history.  Health Maintenance  Topic Date Due   DTaP/Tdap/Td (1 - Tdap) Never done   COVID-19 Vaccine (1 - 2024-25 season) Never done   INFLUENZA VACCINE  08/16/2023    Hepatitis C Screening  Completed   HIV Screening  Completed   HPV VACCINES  Aged Out     ----------------------------------------------------------------------------------------------------------------------------------------------------------------------------------------------------------------- Physical Exam BP (!) 135/90 (BP Location: Left Arm, Patient Position: Sitting, Cuff Size: Large)   Pulse 82   Ht 5\' 8"  (1.727 m)   Wt 241 lb (109.3 kg)   SpO2 99%   BMI 36.64 kg/m   Physical Exam Constitutional:      Appearance: Normal appearance.  Eyes:     General: No scleral icterus. Neurological:     Mental Status: He is alert.  Psychiatric:        Mood and Affect: Mood normal.        Behavior: Behavior normal.     ------------------------------------------------------------------------------------------------------------------------------------------------------------------------------------------------------------------- Assessment and Plan  Vasectomy evaluation Referral placed to urology.    Elevated BP without diagnosis of hypertension Repeat BP is much better.  Discussed low sodium diet.  Continue regular exercise.    No orders of the defined types were placed in this encounter.   No follow-ups on file.    This visit occurred during the SARS-CoV-2 public health emergency.  Safety protocols were in place, including screening questions prior to the visit, additional usage of staff PPE, and extensive cleaning of exam room while  observing appropriate contact time as indicated for disinfecting solutions.

## 2023-04-16 NOTE — Assessment & Plan Note (Signed)
 Repeat BP is much better.  Discussed low sodium diet.  Continue regular exercise.

## 2023-06-05 ENCOUNTER — Encounter: Payer: Self-pay | Admitting: Urology

## 2023-06-05 ENCOUNTER — Ambulatory Visit: Admitting: Urology

## 2023-06-05 VITALS — BP 132/88 | HR 74 | Ht 67.0 in | Wt 230.0 lb

## 2023-06-05 DIAGNOSIS — Z3009 Encounter for other general counseling and advice on contraception: Secondary | ICD-10-CM | POA: Diagnosis not present

## 2023-06-05 MED ORDER — ALPRAZOLAM 1 MG PO TABS: ORAL_TABLET | ORAL | 0 refills | Status: AC

## 2023-06-05 NOTE — Patient Instructions (Signed)
 Vasectomy is a safe, simple, and effective office based procedure that provides men with permanent sterility.  The no scalpel technique has been a refinement but often results in less swelling and pain than the traditional vasectomy method.  Prior to a vasectomy, it is important to make a decision that you are interested in permanent sterility and that you and your partner must be completely sure that you do not want children in the future.  To prepare for the procedure, stop taking any aspirin or blood thinners for 1 week prior to the procedure.  The day of your procedure take a shower and thoroughly clean your scrotum.  It is not necessary to shave prior to the procedure as any shaving that is needed will be performed in the office.  Bring a pair of tight underwear such as briefs, boxer briefs, or athletic shorts with you for the day of the procedure.  You may eat a light meal prior to the procedure.  During the procedure, the scrotal skin will be sterilized completely.  Anesthesia will be provided by injection of a local anesthetic into the scrotum which will provide anesthesia for 2-3 hours after the procedure.  Once the local anesthetic takes effect, a tiny puncture is made in the middle of the front of the scrotum through which both vasa deferens tubes can be partially removed and the ends of the tubes cauterized.  A small absorbable suture is placed in the skin incision.  Antibiotic ointment and sterile gauze dressings are applied and are held in place with the undergarment.  Prescriptions for an antibiotic and pain medication will be sent to your pharmacy.  The antibiotic should be taken to completion.  The pain medication can be taken as needed every 4-6 hours.  If a narcotic pain medication is too strong, over-the-counter analgesics such as Tylenol or ibuprofen may be taken instead.  After the procedure, it is important to take it easy for a couple of days and apply the ice pack to the scrotal area.   The ice pack goes on top of the undergarment and should be used for 10-15 minutes at a time while you are awake.  After the first 2 days, you can gradually increase your activity.  During the first week, it is important to avoid strenuous activity or activity that puts pressure in the scrotal area.  It is also advisable to restrain from heavy lifting or long distance running during this time period.  Often, supportive underwear is helpful to reduce pain during the first week.  You should also avoid sexual activity for 10 days.  You can resume normal activities after 1 week and sexual relations in 10 days.  One of the most important considerations after vasectomy is that you are still fertile after the procedure.  It generally takes at least 20 ejaculations and 3 months time before you are considered sterile.  Even 3 months after the procedure, some men will have a few persistent sperm present.  To be considered sterile, you will need to produce a semen sample that shows no sperm.  You will receive instructions to bring in your first semen specimen to the office 3 months after the procedure.  It is very important that you continue to use your current method of birth control during this time as it is possible to achieve a pregnancy until you become sterile.  Potential complications of vasectomy include bleeding (less than 1% risk of serious bleeding), infection (less than 1%), reconnection of  the vas deferens (01/998 chance), pregnancy (01/1998 chance), shrinkage of the testicle (01/4998 chance), and chronic pain (2-4%).  Other potential consequences of vasectomy include sperm granuloma (a small round area of scar tissue in the area of the procedure is performed), and congestion of the epididymis (a fullness of the tubes where sperm are stored).  Sperm will continue to be produced by the testicles, but the sperm will eventually die and be absorbed by the body.  The amount of semen that is produced will not change.   The main difference is that the semen will not contain sperm after a man has become sterile.  The procedure does not affect your urination, sex drive, or erections.  In summary, vasectomy provides permanent birth control for men.  It is an office-based procedure which is safe, effective, and economical.  After the procedure, it takes time to become sterile so proper precautions must be taken until sterility is achieved.    Taking care of yourself after a VASECTOMY                                              Patient Information Sheet        The following information will reinforce some of the instructions that your doctor has given you.  Day of Procedure: 1) Wear the scrotal supporter and gauze pad 2) Use an ice pack on the scrotum for 15 minutes every hour for 48 hours to help reduce discomfort, swelling and bruising (do NOT place ice directly on your skin, but place on top of the supporter) 3) Expect some clear to pinkish drainage at the surgical site for the first 24-48 hours 4) If needed, use pain medications provided or ibuprofen 800 mg every 8 hours for discomfort 5) Avoid strenuous activities like mowing, lifting, jogging and exercising for 1 week.  Take it easy! 6) If you develop a fever over 101 F or sudden onset of significant swelling within the first 12 hours, please call to report this to your doctor as soon as possible.     Day Two and Three: 1) You may take a shower, but avoid tubs, pools or hot tubs. 2) Continue to wear the scrotal supporter as needed for comfort and change or remove the gauze pad if desired 3) Keep taking it easy!  Avoid strenuous activities like mowing, lifting, jogging and exercising.   4) Continue to watch for signs or symptoms of fever or significant swelling 5) Apply a small amount of antibiotic ointment to incision 1-2 times/day  The rest of the week: 1) Gradually return to normal physical activities after one week.  A return of soreness might  mean you are        "doing too much too soon". 2) Avoid sexual activity for 10 days after the procedure 3) Continue to take a shower, but avoid tubs, pools or hot tubs 4) Wearing the scrotal supporter is optional based on your comfort.     Remember to use an alternate form of contraception for 3 months until you have been checked and CLEARED by your urologist!  58-Month lab appointment:  1) The lab technician will need to look at a semen sample under a microscope  2) Use the specimen cup provided to collect the sample AT HOME 1 hour before the appointment  3) DO NOT refrigerate the specimen, but keep  at room or body temperature  4) Avoid ejaculation for 2-5 days before collecting the specimen  5) Collect the entire specimen by masturbation using NO lubricant  6) Make sure your name, MR number, date and time of collection are on the cup

## 2023-06-05 NOTE — Progress Notes (Signed)
 Assessment: 1. Encounter for vasectomy assessment      Plan: Schedule for vasectomy per patient request Rx for alprazolam 1 mg pre-procedure provided.   Chief Complaint:  Chief Complaint  Patient presents with   VAS Consult    History of Present Illness:  Randy Hatfield is a 38 y.o. male who is seen for vasectomy evaluation. He is married with 3 children.  No history of scrotal trauma or infection.  Past Medical History:  Past Medical History:  Diagnosis Date   No known health problems     Past Surgical History:  Past Surgical History:  Procedure Laterality Date   APPENDECTOMY      Allergies:  No Known Allergies   Family History:  History reviewed. No pertinent family history.  Social History:  Social History   Tobacco Use   Smoking status: Never   Smokeless tobacco: Never  Substance Use Topics   Alcohol use: Yes    Comment: rarely   Drug use: Never    Review of symptoms:  Constitutional:  Negative for unexplained weight loss, night sweats, fever, chills ENT:  Negative for nose bleeds, sinus pain, painful swallowing CV:  Negative for chest pain, shortness of breath, exercise intolerance, palpitations, loss of consciousness Resp:  Negative for cough, wheezing, shortness of breath GI:  Negative for nausea, vomiting, diarrhea, bloody stools GU:  Positives noted in HPI; otherwise negative for gross hematuria, dysuria, urinary incontinence Neuro:  Negative for seizures, poor balance, limb weakness, slurred speech Psych:  Negative for lack of energy, depression, anxiety Endocrine:  Negative for polydipsia, polyuria, symptoms of hypoglycemia (dizziness, hunger, sweating) Hematologic:  Negative for anemia, purpura, petechia, prolonged or excessive bleeding, use of anticoagulants  Allergic:  Negative for difficulty breathing or choking as a result of exposure to anything; no shellfish allergy; no allergic response (rash/itch) to materials, foods  Physical  exam: BP 132/88   Pulse 74   Ht 5\' 7"  (1.702 m)   Wt 230 lb (104.3 kg)   BMI 36.02 kg/m  GENERAL APPEARANCE:  Well appearing, well developed, well nourished, NAD HEENT:  Atraumatic, normocephalic, oropharynx clear NECK:  Supple without lymphadenopathy or thyromegaly ABDOMEN:  Soft, non-tender, no masses EXTREMITIES:  Moves all extremities well, without clubbing, cyanosis, or edema NEUROLOGIC:  Alert and oriented x 3, normal gait, CN II-XII grossly intact MENTAL STATUS:  appropriate BACK:  Non-tender to palpation, No CVAT SKIN:  Warm, dry, and intact GU: Penis:  circumcised Meatus: Normal Scrotum: vas palpated bilaterally Testis: normal without masses bilateral Epididymis: normal  Results: None  VASECTOMY CONSULTATION  Randy Hatfield presents for vasectomy consultation today.  He is a 38 y.o. male, Married with 3 children.  He and his wife have discussed the issues regarding long-term fertility and are comfortable with this decision.  He presents for consideration for vasectomy.  I discussed the issues in detail with him today and he expressed no reservations.  As to the procedure, no scalpel technique vasectomy is explained and reviewed in detail.  Generalized risks including but not limited to bleeding, infection, orchalgia, testicular atrophy, epididymitis, scrotal hematoma, and chronic pain are discussed.   Additionally, he understands that the possibility of vas recanalization following vasectomy is possible although rare.  Most importantly, the patient understands that he is not sterile initially and will need a semen analysis check to confirm sterility such that no sperm are seen.  He is advised to avoid ejaculation for 10 days following the procedure.  The initial semen analysis will  be checked in approximately 12 weeks and in some patients, several months may be required for clearance of all sperm.  He reports a clear understanding of the need for continued birth control until  sterility is confirmed.  Otherwise, general issues regarding local anesthesia, prep, alprazolam are discussed and he reports a clear understanding.

## 2023-07-05 ENCOUNTER — Encounter: Payer: Self-pay | Admitting: Urology

## 2023-07-05 ENCOUNTER — Ambulatory Visit (INDEPENDENT_AMBULATORY_CARE_PROVIDER_SITE_OTHER): Admitting: Urology

## 2023-07-05 VITALS — BP 127/73 | HR 84 | Ht 67.0 in | Wt 230.0 lb

## 2023-07-05 DIAGNOSIS — Z302 Encounter for sterilization: Secondary | ICD-10-CM | POA: Diagnosis not present

## 2023-07-05 MED ORDER — CEPHALEXIN 500 MG PO CAPS: 500.0000 mg | ORAL_CAPSULE | Freq: Three times a day (TID) | ORAL | 0 refills | Status: AC

## 2023-07-05 MED ORDER — HYDROCODONE-ACETAMINOPHEN 5-325 MG PO TABS: 1.0000 | ORAL_TABLET | Freq: Four times a day (QID) | ORAL | 0 refills | Status: AC | PRN

## 2023-07-05 NOTE — Patient Instructions (Signed)

## 2023-07-05 NOTE — Progress Notes (Signed)
   Assessment: 1. Encounter for vasectomy     Plan: Post-vasectomy instructions given. Prescription sent. Post-vasectomy semen analysis in 12 weeks.  Chief Complaint:  Chief Complaint  Patient presents with   VAS   History of Present Illness:  Randy Hatfield is a 38 y.o. male who is seen for vasectomy. He is married with 3 children.  No history of scrotal trauma or infection.  Past Medical History:  Past Medical History:  Diagnosis Date   No known health problems     Past Surgical History:  Past Surgical History:  Procedure Laterality Date   APPENDECTOMY      Allergies:  No Known Allergies   Family History:  No family history on file.  Social History:  Social History   Tobacco Use   Smoking status: Never   Smokeless tobacco: Never  Substance Use Topics   Alcohol use: Yes    Comment: rarely   Drug use: Never    ROS: Constitutional:  Negative for fever, chills, weight loss CV: Negative for chest pain, previous MI, hypertension Respiratory:  Negative for shortness of breath, wheezing, sleep apnea, frequent cough GI:  Negative for nausea, vomiting, bloody stool, GERD  Physical exam: BP 127/73   Pulse 84   Ht 5' 7 (1.702 m)   Wt 230 lb (104.3 kg)   BMI 36.02 kg/m  GENERAL APPEARANCE:  Well appearing, well developed, well nourished, NAD  Results: None  VASECTOMY PROCEDURE:  Randy Hatfield presents for vasectomy following previous vasectomy consultation and permit is signed.  The patient's anterior scrotal wall is shaved and prepped with Betadine in standard sterile fashion.  1% lidocaine is used as local anesthetic in the scrotal and peri vasal tissue.  A standard median raphe punch incision is made and a no scalpel technique vasectomy is performed.  Bilateral vas are isolated from the peri vasal tissue and an approximately 1 cm segment of vas is excised.  Proximal and distal segments are internally cauterized with electric heat cautery. Interposition of  perivasal tissue was performed.  Bilateral palpation confirms bilateral vasectomy defect and no significant bleeding or hematoma is identified.  Neosporin gauze dressing and a scrotal support are applied.    Disposition: Patient is discharged home with Rx for pain medication and antibiotics.  Patient is given routine vasectomy instructions.   Most importantly, he is instructed and cautioned again regarding the need for protected intercourse until such time that a single  negative semen analysis has been obtained.  The initial semen analysis will be checked in approximately 12  weeks.  The patient reports a clear understanding.  He will call with any interval questions or concerns.

## 2023-09-25 DIAGNOSIS — Z1389 Encounter for screening for other disorder: Secondary | ICD-10-CM | POA: Diagnosis not present

## 2023-09-25 DIAGNOSIS — D511 Vitamin B12 deficiency anemia due to selective vitamin B12 malabsorption with proteinuria: Secondary | ICD-10-CM | POA: Diagnosis not present

## 2023-09-25 DIAGNOSIS — E559 Vitamin D deficiency, unspecified: Secondary | ICD-10-CM | POA: Diagnosis not present

## 2023-09-25 DIAGNOSIS — Z13228 Encounter for screening for other metabolic disorders: Secondary | ICD-10-CM | POA: Diagnosis not present

## 2023-09-25 DIAGNOSIS — Z713 Dietary counseling and surveillance: Secondary | ICD-10-CM | POA: Diagnosis not present

## 2023-09-25 DIAGNOSIS — Z6835 Body mass index (BMI) 35.0-35.9, adult: Secondary | ICD-10-CM | POA: Diagnosis not present

## 2023-09-25 DIAGNOSIS — R635 Abnormal weight gain: Secondary | ICD-10-CM | POA: Diagnosis not present

## 2023-10-07 ENCOUNTER — Other Ambulatory Visit
